# Patient Record
Sex: Male | Born: 1990 | Race: Black or African American | Hispanic: No | Marital: Single | Smoking: Current every day smoker
Health system: Southern US, Community
[De-identification: ages and names within clinical notes are randomized; demographics above are authoritative.]

## PROBLEM LIST (undated history)

## (undated) DIAGNOSIS — J45909 Unspecified asthma, uncomplicated: Secondary | ICD-10-CM

## (undated) DIAGNOSIS — F3181 Bipolar II disorder: Secondary | ICD-10-CM

## (undated) HISTORY — DX: Bipolar II disorder: F31.81

---
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
## 2015-08-23 ENCOUNTER — Emergency Department (HOSPITAL_COMMUNITY): Payer: No Typology Code available for payment source

## 2015-08-23 ENCOUNTER — Emergency Department (HOSPITAL_COMMUNITY)
Admission: EM | Admit: 2015-08-23 | Discharge: 2015-08-23 | Disposition: A | Discharge status: Home / Self Care | Payer: No Typology Code available for payment source | Attending: Emergency Medicine | Admitting: Emergency Medicine

## 2015-08-23 ENCOUNTER — Encounter (HOSPITAL_COMMUNITY): Payer: Self-pay | Admitting: Emergency Medicine

## 2015-08-23 DIAGNOSIS — S61212A Laceration without foreign body of right middle finger without damage to nail, initial encounter: Secondary | ICD-10-CM | POA: Diagnosis not present

## 2015-08-23 DIAGNOSIS — K769 Liver disease, unspecified: Secondary | ICD-10-CM | POA: Insufficient documentation

## 2015-08-23 DIAGNOSIS — Y9389 Activity, other specified: Secondary | ICD-10-CM | POA: Insufficient documentation

## 2015-08-23 DIAGNOSIS — Y92828 Other wilderness area as the place of occurrence of the external cause: Secondary | ICD-10-CM | POA: Diagnosis not present

## 2015-08-23 DIAGNOSIS — Z720 Tobacco use: Secondary | ICD-10-CM | POA: Insufficient documentation

## 2015-08-23 DIAGNOSIS — S060X1A Concussion with loss of consciousness of 30 minutes or less, initial encounter: Secondary | ICD-10-CM | POA: Insufficient documentation

## 2015-08-23 DIAGNOSIS — S299XXA Unspecified injury of thorax, initial encounter: Secondary | ICD-10-CM | POA: Diagnosis not present

## 2015-08-23 DIAGNOSIS — S3992XA Unspecified injury of lower back, initial encounter: Secondary | ICD-10-CM | POA: Insufficient documentation

## 2015-08-23 DIAGNOSIS — Y999 Unspecified external cause status: Secondary | ICD-10-CM | POA: Diagnosis not present

## 2015-08-23 DIAGNOSIS — S298XXA Other specified injuries of thorax, initial encounter: Secondary | ICD-10-CM

## 2015-08-23 DIAGNOSIS — J45909 Unspecified asthma, uncomplicated: Secondary | ICD-10-CM | POA: Insufficient documentation

## 2015-08-23 DIAGNOSIS — S0081XA Abrasion of other part of head, initial encounter: Secondary | ICD-10-CM | POA: Insufficient documentation

## 2015-08-23 DIAGNOSIS — S0083XA Contusion of other part of head, initial encounter: Secondary | ICD-10-CM | POA: Insufficient documentation

## 2015-08-23 DIAGNOSIS — S3991XA Unspecified injury of abdomen, initial encounter: Secondary | ICD-10-CM | POA: Diagnosis not present

## 2015-08-23 HISTORY — DX: Unspecified asthma, uncomplicated: J45.909

## 2015-08-23 LAB — COMPREHENSIVE METABOLIC PANEL
ALT: 15 U/L — AB (ref 17–63)
AST: 29 U/L (ref 15–41)
Albumin: 3.9 g/dL (ref 3.5–5.0)
Alkaline Phosphatase: 55 U/L (ref 38–126)
Anion gap: 7 (ref 5–15)
BUN: 13 mg/dL (ref 6–20)
CHLORIDE: 107 mmol/L (ref 101–111)
CO2: 25 mmol/L (ref 22–32)
CREATININE: 1.11 mg/dL (ref 0.61–1.24)
Calcium: 9.3 mg/dL (ref 8.9–10.3)
Glucose, Bld: 106 mg/dL — ABNORMAL HIGH (ref 65–99)
POTASSIUM: 4 mmol/L (ref 3.5–5.1)
SODIUM: 139 mmol/L (ref 135–145)
Total Bilirubin: 0.8 mg/dL (ref 0.3–1.2)
Total Protein: 6.6 g/dL (ref 6.5–8.1)

## 2015-08-23 LAB — CBC
HEMATOCRIT: 42.9 % (ref 39.0–52.0)
Hemoglobin: 14.7 g/dL (ref 13.0–17.0)
MCH: 29.8 pg (ref 26.0–34.0)
MCHC: 34.3 g/dL (ref 30.0–36.0)
MCV: 87 fL (ref 78.0–100.0)
PLATELETS: 135 10*3/uL — AB (ref 150–400)
RBC: 4.93 MIL/uL (ref 4.22–5.81)
RDW: 14.1 % (ref 11.5–15.5)
WBC: 13.1 10*3/uL — ABNORMAL HIGH (ref 4.0–10.5)

## 2015-08-23 LAB — SAMPLE TO BLOOD BANK

## 2015-08-23 LAB — PROTIME-INR
INR: 1.18 (ref 0.00–1.49)
Prothrombin Time: 15.2 seconds (ref 11.6–15.2)

## 2015-08-23 LAB — ETHANOL: Alcohol, Ethyl (B): <5 mg/dL (ref <5)

## 2015-08-23 MED ORDER — IBUPROFEN 600 MG PO TABS: 600.0000 mg | ORAL_TABLET | Freq: Four times a day (QID) | ORAL | Status: DC | PRN

## 2015-08-23 MED ORDER — OXYCODONE-ACETAMINOPHEN 5-325 MG PO TABS
1.0000 | ORAL_TABLET | Freq: Once | ORAL | Status: AC
  Administered 2015-08-23: 1 via ORAL
  Filled 2015-08-23: qty 1

## 2015-08-23 MED ORDER — TETANUS-DIPHTH-ACELL PERTUSSIS 5-2.5-18.5 LF-MCG/0.5 IM SUSP
0.5000 mL | Freq: Once | INTRAMUSCULAR | Status: DC
  Filled 2015-08-23: qty 0.5

## 2015-08-23 MED ORDER — SODIUM CHLORIDE 0.9 % IV BOLUS (SEPSIS)
1000.0000 mL | Freq: Once | INTRAVENOUS | Status: AC
  Administered 2015-08-23: 1000 mL via INTRAVENOUS

## 2015-08-23 MED ORDER — MORPHINE SULFATE (PF) 4 MG/ML IV SOLN
4.0000 mg | Freq: Once | INTRAVENOUS | Status: AC
  Administered 2015-08-23: 4 mg via INTRAVENOUS
  Filled 2015-08-23: qty 1

## 2015-08-23 MED ORDER — IOHEXOL 300 MG/ML  SOLN
100.0000 mL | Freq: Once | INTRAMUSCULAR | Status: AC | PRN
  Administered 2015-08-23: 100 mL via INTRAVENOUS
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
## 2015-08-23 NOTE — ED Notes (Signed)
X-ray at bedside

## 2015-08-23 NOTE — ED Notes (Signed)
Patient ambulated to and from restroom without difficulty.
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
## 2015-08-23 NOTE — Discharge Instructions (Signed)
YOU WILL NEED EVALUATION OF LIVER INCLUDING AN MRI TO LOOK FOR PROBLEMS OR POSSIBLE CANCER PLEASE SEE [facility] FOR THIS

## 2015-08-23 NOTE — ED Notes (Signed)
Pt sts he has drank 1/5 of liquor tonight
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
## 2024-02-08 ENCOUNTER — Ambulatory Visit (INDEPENDENT_AMBULATORY_CARE_PROVIDER_SITE_OTHER): Payer: MEDICAID | Admitting: Physician Assistant

## 2024-02-08 ENCOUNTER — Encounter: Payer: Self-pay | Admitting: Physician Assistant

## 2024-02-08 VITALS — BP 104/65 | HR 84 | Ht 70.0 in | Wt 156.0 lb

## 2024-02-08 DIAGNOSIS — D696 Thrombocytopenia, unspecified: Secondary | ICD-10-CM | POA: Diagnosis not present

## 2024-02-08 DIAGNOSIS — F3181 Bipolar II disorder: Secondary | ICD-10-CM

## 2024-02-08 DIAGNOSIS — Z113 Encounter for screening for infections with a predominantly sexual mode of transmission: Secondary | ICD-10-CM

## 2024-02-08 DIAGNOSIS — R739 Hyperglycemia, unspecified: Secondary | ICD-10-CM

## 2024-02-08 DIAGNOSIS — Z13220 Encounter for screening for lipoid disorders: Secondary | ICD-10-CM | POA: Diagnosis not present

## 2024-02-08 DIAGNOSIS — R21 Rash and other nonspecific skin eruption: Secondary | ICD-10-CM

## 2024-02-08 LAB — TSH: TSH: 1.09 u[IU]/mL (ref 0.35–5.50)

## 2024-02-08 LAB — COMPREHENSIVE METABOLIC PANEL WITH GFR
ALT: 8 U/L (ref 0–53)
AST: 14 U/L (ref 0–37)
Albumin: 4.6 g/dL (ref 3.5–5.2)
Alkaline Phosphatase: 53 U/L (ref 39–117)
BUN: 17 mg/dL (ref 6–23)
CO2: 28 meq/L (ref 19–32)
Calcium: 9.6 mg/dL (ref 8.4–10.5)
Chloride: 104 meq/L (ref 96–112)
Creatinine, Ser: 1.13 mg/dL (ref 0.40–1.50)
GFR: 85.82 mL/min (ref >60.00)
Glucose, Bld: 89 mg/dL (ref 70–99)
Potassium: 3.7 meq/L (ref 3.5–5.1)
Sodium: 139 meq/L (ref 135–145)
Total Bilirubin: 0.5 mg/dL (ref 0.2–1.2)
Total Protein: 7.3 g/dL (ref 6.0–8.3)

## 2024-02-08 LAB — LIPID PANEL
Cholesterol: 110 mg/dL (ref 0–200)
HDL: 43.1 mg/dL (ref >39.00)
LDL Cholesterol: 59 mg/dL (ref 0–99)
NonHDL: 66.93
Total CHOL/HDL Ratio: 3
Triglycerides: 42 mg/dL (ref 0.0–149.0)
VLDL: 8.4 mg/dL (ref 0.0–40.0)

## 2024-02-08 LAB — CBC WITH DIFFERENTIAL/PLATELET
Basophils Absolute: 0 10*3/uL (ref 0.0–0.1)
Basophils Relative: 0.5 % (ref 0.0–3.0)
Eosinophils Absolute: 0.1 10*3/uL (ref 0.0–0.7)
Eosinophils Relative: 1.3 % (ref 0.0–5.0)
HCT: 44.7 % (ref 39.0–52.0)
Hemoglobin: 14.9 g/dL (ref 13.0–17.0)
Lymphocytes Relative: 27.9 % (ref 12.0–46.0)
Lymphs Abs: 2.1 10*3/uL (ref 0.7–4.0)
MCHC: 33.3 g/dL (ref 30.0–36.0)
MCV: 88.8 fl (ref 78.0–100.0)
Monocytes Absolute: 0.8 10*3/uL (ref 0.1–1.0)
Monocytes Relative: 10.3 % (ref 3.0–12.0)
Neutro Abs: 4.5 10*3/uL (ref 1.4–7.7)
Neutrophils Relative %: 60 % (ref 43.0–77.0)
Platelets: 140 10*3/uL — ABNORMAL LOW (ref 150.0–400.0)
RBC: 5.04 Mil/uL (ref 4.22–5.81)
RDW: 15 % (ref 11.5–15.5)
WBC: 7.5 10*3/uL (ref 4.0–10.5)

## 2024-02-08 LAB — HEMOGLOBIN A1C: Hgb A1c MFr Bld: 5.7 % (ref 4.6–6.5)

## 2024-02-08 MED ORDER — TRIAMCINOLONE ACETONIDE 0.1 % EX CREA: 1.0000 | TOPICAL_CREAM | Freq: Two times a day (BID) | CUTANEOUS | 0 refills | Status: DC

## 2024-02-08 NOTE — Progress Notes (Signed)
 New patient visit   Patient: Ross Thompson   DOB: [birth year]   32 y.o. Male  MRN: [id number] Visit Date: 02/08/2024  Today's healthcare provider: [name], PA-C   Cc. New patient  Subjective    Ross Thompson is a 33 y.o. male who presents today as a new patient to establish care.   Pt presents today as he wants to 'check on his health'. Initially he denies any specific concerns, but then reports an ongoing skin condition -- on his forehead, that in prison, is treated with a pink 'stinky' cream. Reports it is itchy. Recalls thinking it is eczema, but that someone also told him it was fungal.   Pt also reports a friend told him to 'check on his nuts' and that when he thought about it, things are different. When I asked to clarify-- pt denies swelling, admits to a rash, but is unable to explain, denies change in urinary, denies discharge. Reports a lump that appeared after recent intercourse. Pt reports it has been there a few days. Denies pain, but at one point there was discharge.    Past Medical History:  Diagnosis Date   Asthma    Bipolar 2 disorder (HCC)    History reviewed. No pertinent surgical history. No family status information on file.   History reviewed. No pertinent family history. Social History   Socioeconomic History   Marital status: Single    Spouse name: Not on file   Number of children: Not on file   Years of education: Not on file   Highest education level: Not on file  Occupational History   Not on file  Tobacco Use   Smoking status: Every Day   Smokeless tobacco: Not on file  Substance and Sexual Activity   Alcohol use: Yes   Drug use: Yes    Types: Marijuana   Sexual activity: Not on file  Other Topics Concern   Not on file  Social History Narrative   Not on file   Social Drivers of Health   Financial Resource Strain: Not on file  Food Insecurity: Not on file  Transportation Needs: Not on file  Physical Activity: Not on file   Stress: Not on file  Social Connections: Not on file   Outpatient Medications Prior to Visit  Medication Sig   divalproex (DEPAKOTE) 250 MG DR tablet Take 1 tablet by mouth daily.   mirtazapine (REMERON SOL-TAB) 15 MG disintegrating tablet Take 15 mg by mouth at bedtime.   [DISCONTINUED] ibuprofen  (ADVIL ,MOTRIN ) 600 MG tablet Take 1 tablet (600 mg total) by mouth every 6 (six) hours as needed.   No facility-administered medications prior to visit.   No Known Allergies  Immunization History  Administered Date(s) Administered   Tdap 12/25/2021    Health Maintenance  Topic Date Due   HIV Screening  Never done   Hepatitis C Screening  Never done   Pneumococcal Vaccine 63-45 Years old (1 of 2 - PCV) Never done   COVID-19 Vaccine (1 - 2024-25 season) Never done   INFLUENZA VACCINE  05/16/2024   DTaP/Tdap/Td (2 - Td or Tdap) 12/26/2031   HPV VACCINES  Aged Out   Meningococcal B Vaccine  Aged Out    Patient Care Team: [name], PA-C as PCP - General (Physician Assistant)  Review of Systems  Constitutional:  Negative for fatigue and fever.  Respiratory:  Negative for cough and shortness of breath.   Cardiovascular:  Negative for chest pain, palpitations and leg  swelling.  Genitourinary:  Positive for genital sores.  Neurological:  Negative for dizziness and headaches.        Objective    BP 104/65   Pulse 84   Ht 5\' 10"  (1.778 m)   Wt 156 lb (70.8 kg)   BMI 22.38 kg/m     Physical Exam Constitutional:      General: He is awake.     Appearance: He is well-developed.  HENT:     Head: Normocephalic.  Eyes:     Conjunctiva/sclera: Conjunctivae normal.  Cardiovascular:     Rate and Rhythm: Normal rate and regular rhythm.     Heart sounds: Normal heart sounds.  Pulmonary:     Effort: Pulmonary effort is normal.     Breath sounds: Normal breath sounds.  Genitourinary:    Comments: Testicular exam is normal  On penile shaft there are several ulcerated  spots-- but without any erythema, skin breakdown or discharge. Nontender. Skin:    General: Skin is warm.     Comments: There are defined pores across forehead and cheeks, some scattered papules and minimally dry skin.   Neurological:     Mental Status: He is alert and oriented to person, place, and time.  Psychiatric:        Attention and Perception: Attention normal.        Mood and Affect: Mood normal.        Speech: Speech normal.        Behavior: Behavior is cooperative.    Depression Screen    02/08/2024    8:59 AM  PHQ 2/9 Scores  PHQ - 2 Score 4   No results found for any visits on 02/08/24.  Assessment & Plan     Routine screening for STI (sexually transmitted infection) Suspicion for syphillis ? Lesions are nondiagnostic based on exam. Will check all stis, and tx as indicated -     Urine cytology ancillary only -     HIV Antibody (routine testing w rflx) -     Hepatitis C antibody -     RPR  Hyperglycemia -     Hemoglobin A1c  Thrombocytopenia (HCC) -     CBC with Differential/Platelet -     Comprehensive metabolic panel with GFR  Bipolar 2 disorder (HCC) Pt follows with psych -     TSH  Lipid screening -     Lipid panel  Rash Rx topical steroid for itchy dry rash. I am not sure what the pink 'stinky' lotion is, skin does not appear to have a fungal infection. -     Triamcinolone Acetonide; Apply 1 Application topically 2 (two) times daily.  Dispense: 30 g; Refill: 0   Return if symptoms worsen or fail to improve.      [name], PA-C  [facility] [phone] (phone) [phone] (fax)  [facility]

## 2024-02-09 LAB — HEPATITIS C ANTIBODY: Hepatitis C Ab: NONREACTIVE

## 2024-02-09 LAB — RPR: RPR Ser Ql: NONREACTIVE

## 2024-02-09 LAB — HIV ANTIBODY (ROUTINE TESTING W REFLEX): HIV 1&2 Ab, 4th Generation: NONREACTIVE

## 2024-02-10 ENCOUNTER — Encounter: Payer: Self-pay | Admitting: Physician Assistant

## 2024-02-11 ENCOUNTER — Encounter: Payer: Self-pay | Admitting: Physician Assistant

## 2024-02-11 DIAGNOSIS — D696 Thrombocytopenia, unspecified: Secondary | ICD-10-CM | POA: Insufficient documentation

## 2024-02-15 ENCOUNTER — Other Ambulatory Visit: Payer: MEDICAID

## 2024-02-15 ENCOUNTER — Other Ambulatory Visit (HOSPITAL_COMMUNITY)
Admission: RE | Admit: 2024-02-15 | Discharge: 2024-02-15 | Disposition: A | Discharge status: Home / Self Care | Payer: MEDICAID | Source: Ambulatory Visit | Attending: Physician Assistant | Admitting: Physician Assistant

## 2024-02-15 ENCOUNTER — Ambulatory Visit: Payer: MEDICAID | Admitting: Physician Assistant

## 2024-02-15 ENCOUNTER — Telehealth: Payer: Self-pay | Admitting: Physician Assistant

## 2024-02-15 DIAGNOSIS — Z113 Encounter for screening for infections with a predominantly sexual mode of transmission: Secondary | ICD-10-CM | POA: Diagnosis present

## 2024-02-15 NOTE — Addendum Note (Signed)
 Addended by: Dellie Fergusson on: 02/15/2024 09:12 AM   Modules accepted: Orders

## 2024-02-15 NOTE — Telephone Encounter (Signed)
  I have this pt on my lab schedule today just need some orders.

## 2024-02-15 NOTE — Addendum Note (Signed)
 Addended by: Dellie Fergusson on: 02/15/2024 09:15 AM   Modules accepted: Orders

## 2024-02-18 ENCOUNTER — Encounter: Payer: Self-pay | Admitting: Physician Assistant

## 2024-02-18 ENCOUNTER — Other Ambulatory Visit: Payer: Self-pay | Admitting: Physician Assistant

## 2024-02-18 DIAGNOSIS — N489 Disorder of penis, unspecified: Secondary | ICD-10-CM

## 2024-02-18 LAB — URINE CYTOLOGY ANCILLARY ONLY
Chlamydia: NEGATIVE
Comment: NEGATIVE
Comment: NEGATIVE
Comment: NORMAL
Neisseria Gonorrhea: NEGATIVE
Trichomonas: NEGATIVE

## 2024-02-26 DIAGNOSIS — F201 Disorganized schizophrenia: Secondary | ICD-10-CM | POA: Insufficient documentation

## 2024-03-04 ENCOUNTER — Telehealth: Payer: Self-pay

## 2024-03-05 NOTE — Transitions of Care (Post Inpatient/ED Visit) (Signed)
   03/05/2024  Name: Ross Thompson MRN: [id number] DOB: 10-13-1991  Today's TOC FU Call Status: Today's TOC FU Call Status:: Successful TOC FU Call Completed TOC FU Call Complete Date: 03/04/24 Patient's Name and Date of Birth confirmed.  Transition Care Management Follow-up Telephone Call Date of Discharge: 03/03/24 Discharge Facility: [facility] Type of Discharge: Inpatient Admission Primary Inpatient Discharge Diagnosis:: HI How have you been since you were released from the hospital?: Better Any questions or concerns?: No  Items Reviewed: Did you receive and understand the discharge instructions provided?: Yes Medications obtained,verified, and reconciled?: Yes (Medications Reviewed) Any new allergies since your discharge?: No Dietary orders reviewed?: NA Do you have support at home?: No  Medications Reviewed Today: Medications Reviewed Today   Medications were not reviewed in this encounter     Home Care and Equipment/Supplies: Were Home Health Services Ordered?: NA Any new equipment or medical supplies ordered?: NA  Functional Questionnaire: Do you need assistance with bathing/showering or dressing?: No Do you need assistance with meal preparation?: No Do you need assistance with eating?: No Do you have difficulty maintaining continence: No Do you need assistance with getting out of bed/getting out of a chair/moving?: No Do you have difficulty managing or taking your medications?: No  Follow up appointments reviewed: PCP Follow-up appointment confirmed?: Yes Date of PCP follow-up appointment?: 03/06/24 Follow-up Provider: [facility] Follow-up appointment confirmed?: No Reason Specialist Follow-Up Not Confirmed: Patient has Specialist Provider Number and will Call for Appointment Do you need transportation to your follow-up appointment?: No Do you understand care options if your condition(s) worsen?: Yes-patient verbalized  understanding    SIGNATURE Darrall Ellison, LPN [facility] Nurse Health Advisor Direct Dial [phone]

## 2024-03-06 ENCOUNTER — Inpatient Hospital Stay: Payer: MEDICAID | Admitting: Physician Assistant

## 2024-03-18 NOTE — Progress Notes (Signed)
    Chief Complaint: No chief complaint on file.   History of Present Illness:  Ross Thompson is a 33 y.o. male who is seen in consultation from Ross Thompson, [place] for evaluation of penile lesions.  Ross Thompson says that he he noticed these 2 to 3 years ago.  They get irritated with sexual activity, but otherwise are not terribly bothersome.  Once in a while, he will express a little bit of fluid from these areas.  They are not usually tender.  He has used a cream or 2 on these.  He is significantly worried that he has an STD.  He has negative testing for these, however.   Past Medical History:  Past Medical History:  Diagnosis Date   Asthma    Bipolar 2 disorder (HCC)     Past Surgical History:  No past surgical history on file.  Allergies:  No Known Allergies  Family History:  No family history on file.  Social History:  Social History   Tobacco Use   Smoking status: Every Day  Substance Use Topics   Alcohol use: Yes   Drug use: Yes    Types: Marijuana    Review of symptoms:  Constitutional:  Negative for unexplained weight loss, night sweats, fever, chills ENT:  Negative for nose bleeds, sinus pain, painful swallowing CV:  Negative for chest pain, shortness of breath, exercise intolerance, palpitations, loss of consciousness Resp:  Negative for cough, wheezing, shortness of breath GI:  Negative for nausea, vomiting, diarrhea, bloody stools GU:  Positives noted in HPI; otherwise negative for gross hematuria, dysuria, urinary incontinence Neuro:  Negative for seizures, poor balance, limb weakness, slurred speech Psych:  Negative for lack of energy, depression, anxiety Endocrine:  Negative for polydipsia, polyuria, symptoms of hypoglycemia (dizziness, hunger, sweating) Hematologic:  Negative for anemia, purpura, petechia, prolonged or excessive bleeding, use of anticoagulants  Allergic:  Negative for difficulty breathing or choking as a result of exposure to anything;  no shellfish allergy; no allergic response (rash/itch) to materials, foods  Physical exam: There were no vitals taken for this visit. GENERAL APPEARANCE:  Well appearing, well developed, well nourished, NAD HEENT: Atraumatic, Normocephalic. NECK: Normal appearance LUNGS: Normal inspiratory and expiratory excursion HEART: Regular Rate ABDOMEN: No inguinal hernias GU: Phallus is circumcised.  There are several areas of scarring along the median raphae on the ventral aspect of the penis.  These look like areas that had been small sebaceous cysts and are now scarred.  I am not concerned about an infectious quality to these lesions.  Scrotal skin normal. Testicles/epididymal structures normal. Meatus normal. EXTREMITIES: Moves all extremities well.  Without clubbing, cyanosis, or edema. NEUROLOGIC:  Alert and oriented x 3, normal gait, CN II-XII grossly intact.  MENTAL STATUS:  Appropriate. SKIN:  Warm, dry and intact.    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

## 2024-03-19 ENCOUNTER — Encounter: Payer: Self-pay | Admitting: Urology

## 2024-03-19 ENCOUNTER — Ambulatory Visit (INDEPENDENT_AMBULATORY_CARE_PROVIDER_SITE_OTHER): Payer: MEDICAID | Admitting: Urology

## 2024-03-19 VITALS — BP 105/73 | HR 73 | Ht 70.0 in | Wt 154.0 lb

## 2024-03-19 DIAGNOSIS — N489 Disorder of penis, unspecified: Secondary | ICD-10-CM | POA: Diagnosis not present

## 2024-03-19 LAB — URINALYSIS, ROUTINE W REFLEX MICROSCOPIC
Bilirubin, UA: NEGATIVE
Glucose, UA: NEGATIVE
Leukocytes,UA: NEGATIVE
Nitrite, UA: NEGATIVE
Specific Gravity, UA: 1.02 (ref 1.005–1.030)
Urobilinogen, Ur: 1 mg/dL (ref 0.2–1.0)
pH, UA: 6 (ref 5.0–7.5)

## 2024-03-19 LAB — MICROSCOPIC EXAMINATION

## 2024-03-25 ENCOUNTER — Encounter: Payer: Self-pay | Admitting: Physician Assistant

## 2024-03-25 ENCOUNTER — Telehealth: Payer: Self-pay | Admitting: Physician Assistant

## 2024-03-25 NOTE — Telephone Encounter (Signed)
 Pt dropped off form to be completed so he can get assistance. Pt asks to call care giver listed on DPR when FL2 is complete [facility]

## 2024-03-25 NOTE — Telephone Encounter (Signed)
 Copied from CRM [id number]. Topic: General - Other >> Mar 25, 2024  8:21 AM Martinique E wrote: Reason for CRM: Patient has an FL2 form that he needs to fill out with the recommended level of care. Patient questioning when he can stop by today to drop that off for PCP to fill out. Callback number for patient is [phone].

## 2024-03-25 NOTE — Telephone Encounter (Signed)
Form placed in providers basket.

## 2024-03-28 ENCOUNTER — Telehealth: Payer: Self-pay

## 2024-03-28 NOTE — Telephone Encounter (Signed)
 Copied from CRM [id number]. Topic: General - Other >> Mar 28, 2024  9:15 AM Adonis Hoot wrote: Reason for CRM: Patient called in to see if the [facility] for that he dropped off on 03/25/2024,was ready to be picked up?Patient is requesting a phone cal as well.

## 2024-03-28 NOTE — Telephone Encounter (Signed)
 Spoke with patient regarding information needed for FL2 paperwork to be filled out. Patient states will have psychiatrist give Drubel a call.

## 2024-03-28 NOTE — Telephone Encounter (Signed)
 Patient return call and informed of provider notation. Patient stated that the psychiatric stated that the [facility] form has to be completed by his PCP. CB#[phone].

## 2024-03-31 NOTE — Telephone Encounter (Signed)
 Faxed form to pt's psych doctor to fill out per pcp instructions.

## 2024-04-01 ENCOUNTER — Telehealth: Payer: Self-pay | Admitting: *Deleted

## 2024-04-01 NOTE — Telephone Encounter (Signed)
 Copied from CRM [id number]. Topic: General - Other >> Mar 28, 2024  9:15 AM Adonis Hoot wrote: Reason for CRM: Patient called in to see if the [facility] for that he dropped off on 03/25/2024,was ready to be picked up?Patient is requesting a phone cal as well. >> Apr 01, 2024  4:25 PM Howard Macho wrote: Gertrude Kuba (physiatrist) from [facility] called wanting to speak to PA [name] and the patient told Gertrude Kuba that Is who to speak with regarding his FL2 form CB [phone]

## 2024-04-02 NOTE — Telephone Encounter (Signed)
 Spoke with pt's psychiatrist.  Explained to her that we can't fill out the [facility] form since we don't treat or prescribe his psych meds.  Advised her that the form had been faxed to her office last week.

## 2024-06-18 ENCOUNTER — Ambulatory Visit: Payer: MEDICAID | Admitting: Urology

## 2024-06-18 NOTE — Progress Notes (Incomplete)
    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   History of Present Illness:  6.4.2025: Initial  visit for evaluation of penile lesions.  Ross Thompson says that he he noticed these 2 to 3 years ago.  They get irritated with sexual activity, but otherwise are not terribly bothersome.  Once in a while, he will express a little bit of fluid from these areas.  They are not usually tender.  He has used a cream or 2 on these.  He is significantly worried that he has an STD.  He has negative testing for these, however.   Past Medical History:  Past Medical History:  Diagnosis Date   Asthma    Bipolar 2 disorder (HCC)     Past Surgical History:  No past surgical history on file.  Allergies:  No Known Allergies  Family History:  No family history on file.  Social History:  Social History   Tobacco Use   Smoking status: Every Day  Substance Use Topics   Alcohol use: Yes   Drug use: Yes    Types: Marijuana    Review of symptoms:  Constitutional:  Negative for unexplained weight loss, night sweats, fever, chills ENT:  Negative for nose bleeds, sinus pain, painful swallowing CV:  Negative for chest pain, shortness of breath, exercise intolerance, palpitations, loss of consciousness Resp:  Negative for cough, wheezing, shortness of breath GI:  Negative for nausea, vomiting, diarrhea, bloody stools GU:  Positives noted in HPI; otherwise negative for gross hematuria, dysuria, urinary incontinence Neuro:  Negative for seizures, poor balance, limb weakness, slurred speech Psych:  Negative for lack of energy, depression, anxiety Endocrine:  Negative for polydipsia, polyuria, symptoms of hypoglycemia (dizziness, hunger, sweating) Hematologic:   Negative for anemia, purpura, petechia, prolonged or excessive bleeding, use of anticoagulants  Allergic:  Negative for difficulty breathing or choking as a result of exposure to anything; no shellfish allergy; no allergic response (rash/itch) to materials, foods  Physical exam: There were no vitals taken for this visit. GENERAL APPEARANCE:  Well appearing, well developed, well nourished, NAD HEENT: Atraumatic, Normocephalic. NECK: Normal appearance LUNGS: Normal inspiratory and expiratory excursion HEART: Regular Rate ABDOMEN: No inguinal hernias GU: Phallus is circumcised.  There are several areas of scarring along the median raphae on the ventral aspect of the penis.  These look like areas that had been small sebaceous cysts and are now scarred.  I am not concerned about an infectious quality to these lesions.  Scrotal skin normal. Testicles/epididymal structures normal. Meatus normal. EXTREMITIES: Moves all extremities well.  Without clubbing, cyanosis, or edema. NEUROLOGIC:  Alert and oriented x 3, normal gait, CN II-XII grossly intact.  MENTAL STATUS:  Appropriate. SKIN:  Warm, dry and intact.    Results:  I have reviewed referring/prior physicians notes  I have reviewed urinalysis

## 2024-09-23 ENCOUNTER — Other Ambulatory Visit: Payer: Self-pay

## 2024-09-23 ENCOUNTER — Other Ambulatory Visit (HOSPITAL_BASED_OUTPATIENT_CLINIC_OR_DEPARTMENT_OTHER): Payer: Self-pay

## 2024-09-23 ENCOUNTER — Emergency Department (HOSPITAL_BASED_OUTPATIENT_CLINIC_OR_DEPARTMENT_OTHER)
Admission: EM | Admit: 2024-09-23 | Discharge: 2024-09-23 | Disposition: A | Discharge status: Home / Self Care | Payer: MEDICAID | Attending: Emergency Medicine | Admitting: Emergency Medicine

## 2024-09-23 ENCOUNTER — Encounter (HOSPITAL_BASED_OUTPATIENT_CLINIC_OR_DEPARTMENT_OTHER): Payer: Self-pay

## 2024-09-23 DIAGNOSIS — K0889 Other specified disorders of teeth and supporting structures: Secondary | ICD-10-CM

## 2024-09-23 DIAGNOSIS — K029 Dental caries, unspecified: Secondary | ICD-10-CM

## 2024-09-23 MED ORDER — OXYCODONE HCL 5 MG PO TABS
5.0000 mg | ORAL_TABLET | ORAL | 0 refills | Status: DC | PRN
  Filled 2024-09-23: qty 15, 3d supply, fill #0

## 2024-09-23 MED ORDER — AMOXICILLIN-POT CLAVULANATE 875-125 MG PO TABS
1.0000 | ORAL_TABLET | Freq: Two times a day (BID) | ORAL | 0 refills | Status: DC
  Filled 2024-09-23: qty 14, 7d supply, fill #0

## 2024-09-23 NOTE — ED Notes (Signed)
 Discharge instructions reviewed with patient. Patient verbalizes understanding, no further questions at this time. Medications/prescriptions and follow up information provided. No acute distress noted at time of departure.

## 2024-09-23 NOTE — ED Provider Notes (Signed)
 [place] [facility] Provider Note   CSN: [id number] Arrival date & time: 09/23/24  9292     Patient presents with: Dental Pain   Ross Thompson is a 33 y.o. male.   33 year old male who presents emergency department dental pain.  Has had a cracked tooth for a long time.  In the past 2 days started becoming more painful and swollen.  Took ibuprofen  and an extra Percocet that he had at home with minimal relief.  Does not currently have dental insurance.  No fevers or chills.  No difficulty speaking or swallowing.       Prior to Admission medications   Medication Sig Start Date End Date Taking? Authorizing Provider  divalproex (DEPAKOTE) 250 MG DR tablet Take 1 tablet by mouth daily. Patient not taking: Reported on 03/19/2024 12/26/21   [provider]  mirtazapine (REMERON SOL-TAB) 15 MG disintegrating tablet Take 15 mg by mouth at bedtime. Patient not taking: Reported on 03/19/2024    [provider]  triamcinolone  cream (KENALOG ) 0.1 % Apply 1 Application topically 2 (two) times daily. Patient not taking: Reported on 03/19/2024 02/08/24   Cyndi Shaver, PA-C    Allergies: Patient has no known allergies.    Review of Systems  Updated Vital Signs BP 112/72 (BP Location: Right Arm)   Pulse 71   Temp 97.7 F (36.5 C) (Oral)   Resp 16   Ht 5' 10 (1.778 m)   Wt 77.1 kg   SpO2 100%   BMI 24.39 kg/m   Physical Exam HENT:     Mouth/Throat:     Comments: Uvula: without deviation or edema. Uvula midline. Soft palate: without swelling Sublingual: normal appearance/no brawny edema or tongue elevation Teeth and gums: No periapical swelling or fluctuance, no gingival swelling, no active oral bleeding.  Tooth #18 with a fracture.  Is tender to palpation. Tonsils: no erythema or exudates  Eyes:     Extraocular Movements: Extraocular movements intact.     Conjunctiva/sclera: Conjunctivae normal.     Pupils: Pupils are equal,  round, and reactive to light.  Neurological:     Mental Status: He is alert.     (all labs ordered are listed, but only abnormal results are displayed) Labs Reviewed - No data to display  EKG: None  Radiology: No results found.   Procedures   Medications Ordered in the ED - No data to display                                  Medical Decision Making  Ross Thompson is a 34 y.o. male with comorbidities that complicate the patient evaluation including broken tooth who presents to the emergency department with dental pain  Initial Ddx:  Dental carie, periapical abscess, deep space infection, Ludwig's angina  MDM:  Feel that patient likely has a dental carie that is causing their symptoms.  Given their exam feel that deep space infection is less likely so we will hold off on CT imaging will treat with antibiotics. No signs of Ludwig's angina or submandibular swelling at this time.  Plan:  Augmentin  Antibiotics Dentistry referral  ED Summary/Re-evaluation:  Patient prescribed antibiotics and instructed to use tylenol  and ibuprofen  for their pain will have them follow-up with dentistry in the next 1 to 2 days.  Oxycodone  prescribed for breakthrough pain  This patient presents to the ED for concern of complaints listed  in HPI, this involves an extensive number of treatment options, and is a complaint that carries with it a high risk of complications and morbidity. Disposition including potential need for admission considered.   Dispo: DC Home. Return precautions discussed including, but not limited to, those listed in the AVS. Allowed pt time to ask questions which were answered fully prior to dc.  Records reviewed Outpatient Clinic Notes I have reviewed the patients home medications and made adjustments as needed   Final diagnoses:  Pain, dental  Dental caries    ED Discharge Orders     None          Yolande Lamar BROCKS, MD 09/23/24 0730

## 2024-09-23 NOTE — ED Triage Notes (Addendum)
 C/o left lower dental pain and swelling x 2 days. States tooth has been broken for a while. Hasn't seen a dentist in years.  Last took ibuprofen  at 0100

## 2024-09-23 NOTE — Discharge Instructions (Signed)
 [facility] -  [address] [place] - [place] [address] [phone]  [facility]) treats patients who are in the following groups:  Pediatrics with medicaid up to age 33 Patients over 39 with an orange card and a referral from a medical provider.  - If they are referred - there is a 40$ copay for any service - extration etc.  To get an orange card the following process is necessary.  Call the [facility] to apply for card - [phone] Once approved, you the patient will be set up with a medical provider Medical provider will refer patient to the [facility] clinic .  Offices accepting Medicaid patients:  Urgent tooth - [address] [place], [place] [address] [phone]  [facility] - [address], [place] [address] [phone]  [facility] (will see emergency dental patients from ED) - [address] [place], [place] [address] [phone]    Please return to the emergency department if you develop any difficulty breathing or other concerning symptoms.

## 2024-10-27 NOTE — Progress Notes (Incomplete)
 "   New Patient Office Visit   Subjective     Patient ID: Ross Thompson, male   DOB: 1991/07/16  Age: 34 y.o. MRN: [id number]   CC:  No chief complaint on file.     HPI Ross Thompson presents to establish care.     Mood follow-up: - Diagnosis: Schizophrenia; Bipolar Disorder - Treatment: Haloperidol 5 mg BID. - Medication side effects:  - SI/HI:  - Update:     02/08/2024    8:59 AM  Depression screen PHQ 2/9  Decreased Interest 1  Down, Depressed, Hopeless 3  PHQ - 2 Score 4   Show/hide medication list[1] Past Medical History:  Diagnosis Date   Asthma    Bipolar 2 disorder (HCC)     No past surgical history on file.   No family history on file.  Social History   Socioeconomic History   Marital status: Single    Spouse name: Not on file   Number of children: Not on file   Years of education: Not on file   Highest education level: Not on file  Occupational History   Not on file  Tobacco Use   Smoking status: Every Day   Smokeless tobacco: Not on file  Substance and Sexual Activity   Alcohol use: Yes   Drug use: Yes    Types: Marijuana   Sexual activity: Not on file  Other Topics Concern   Not on file  Social History Narrative   Not on file   Social Drivers of Health   Tobacco Use: High Risk (09/23/2024)   Patient History    Smoking Tobacco Use: Every Day    Smokeless Tobacco Use: Unknown    Passive Exposure: Not on file  Financial Resource Strain: Not on file  Food Insecurity: Not on file  Transportation Needs: Not on file  Physical Activity: Not on file  Stress: Not on file  Social Connections: Not on file  Depression (PHQ2-9): Low Risk (02/08/2024)   Depression (PHQ2-9)    PHQ-2 Score: 4  Alcohol Screen: Not on file  Housing: Not on file  Utilities: Not on file  Health Literacy: Not on file       ROS All review of systems negative except what is listed in the HPI    Objective     There were no vitals taken for this  visit.  Physical Exam     Assessment & Plan:     Problem List Items Addressed This Visit   None            No follow-ups on file.  Waddell KATHEE Mon, NP  I,Emily Lagle,acting as a scribe for Waddell KATHEE Mon, NP.,have documented all relevant documentation on the behalf of Waddell KATHEE Mon, NP.  I, Waddell KATHEE Mon, NP, have reviewed all documentation for this visit. The documentation on 10/31/2024 for the exam, diagnosis, procedures, and orders are all accurate and complete.     [1]  Outpatient Medications Prior to Visit  Medication Sig   amoxicillin -clavulanate (AUGMENTIN ) 875-125 MG tablet Take 1 tablet by mouth every 12 (twelve) hours.   divalproex (DEPAKOTE) 250 MG DR tablet Take 1 tablet by mouth daily. (Patient not taking: Reported on 03/19/2024)   mirtazapine (REMERON SOL-TAB) 15 MG disintegrating tablet Take 15 mg by mouth at bedtime. (Patient not taking: Reported on 03/19/2024)   oxyCODONE  (ROXICODONE ) 5 MG immediate release tablet Take 1 tablet (5 mg total) by mouth every 4 (four) hours as needed for severe pain (  pain score 7-10).   triamcinolone  cream (KENALOG ) 0.1 % Apply 1 Application topically 2 (two) times daily. (Patient not taking: Reported on 03/19/2024)   No facility-administered medications prior to visit.   "

## 2024-10-31 ENCOUNTER — Encounter: Payer: MEDICAID | Admitting: Family Medicine

## 2024-10-31 DIAGNOSIS — Z0000 Encounter for general adult medical examination without abnormal findings: Secondary | ICD-10-CM
